# Patient Record
Sex: Female | Born: 1995 | Race: Asian | Hispanic: No | Marital: Single | Smoking: Current some day smoker
Health system: Southern US, Community
[De-identification: ages and names within clinical notes are randomized; demographics above are authoritative.]

---

## 2018-05-17 ENCOUNTER — Encounter (INDEPENDENT_AMBULATORY_CARE_PROVIDER_SITE_OTHER): Payer: Self-pay | Admitting: Internal Medicine

## 2018-05-17 ENCOUNTER — Ambulatory Visit (INDEPENDENT_AMBULATORY_CARE_PROVIDER_SITE_OTHER): Payer: BLUE CROSS/BLUE SHIELD | Admitting: Internal Medicine

## 2018-05-17 DIAGNOSIS — R1010 Upper abdominal pain, unspecified: Secondary | ICD-10-CM

## 2018-05-17 MED ORDER — PANTOPRAZOLE SODIUM 40 MG PO TBEC: 40.0000 mg | DELAYED_RELEASE_TABLET | Freq: Every day | ORAL | 1 refills | Status: AC

## 2018-05-17 NOTE — Patient Instructions (Signed)
HIDA scan. Rx for Protonix sent to her pharmacy

## 2018-05-17 NOTE — Progress Notes (Addendum)
   Subjective:    Patient ID: Teresa Thompson, female    DOB: 17-Sep-1996, 22 y.o.   MRN: [id number]  HPI Referred by Felicie Mornavid Smith PA-C  for constipation. Seen by PA-C for abdominal pain and was referred to the ED. Seen in the ED. She was given IV and pain medication.  She underwent a CT scan which was normal.  She was given Omeprazole. Finished the Omeprazole and now is OTC Omeprazole.  She had been hurting for 2 1/2 weeks. The pain is not constant.  Then pain occurs after eating.  She is not taking any NSAIDs. Last episode of pain was yesterday at lunch. She ate salad yesterday. She is on a Keto diet. She is avoiding spicy foods. Appetite is good. She has weight loss since starting the Keto diet 2 months ago.  Works at [employer] in [place]. Single, no children.  04/30/2018 CT abdomen/pelvis with CM: Abdominal pain without trauma No acute findings in the abdomen or pelvis. Tiny nonobstructing renal stone. Left ovarian cyst.  Review of Systems History reviewed. No pertinent past medical history.  History reviewed. No pertinent surgical history.  Allergies  Allergen Reactions  . Amoxicillin     Current Outpatient Medications on File Prior to Visit  Medication Sig Dispense Refill  . nitrofurantoin (MACRODANTIN) 100 MG capsule Take 100 mg by mouth 2 (two) times daily.    . NON FORMULARY BC pills.     No current facility-administered medications on file prior to visit.         Objective:   Physical Exam Blood pressure (!) 142/80, pulse 72, temperature (!) 97.5 F (36.4 C), height 5\' 2"  (1.575 m), weight 254 lb 14.4 oz (115.6 kg). Alert and oriented. Skin warm and dry. Oral mucosa is moist.   . Sclera anicteric, conjunctivae is pink. Thyroid not enlarged. No cervical lymphadenopathy. Lungs clear. Heart regular rate and rhythm.  Abdomen is soft. Bowel sounds are positive. No hepatomegaly. No abdominal masses felt. No tenderness.  No edema to lower extremities.             Assessment & Plan:  Epigastric pain after eating. Has been on a high fat diet. Am going to get a HIDA scan and Rx for Protonix sent to his pharmacy.,

## 2018-05-27 ENCOUNTER — Encounter (HOSPITAL_COMMUNITY)
Admission: RE | Admit: 2018-05-27 | Discharge: 2018-05-27 | Disposition: A | Discharge status: Home / Self Care | Payer: BLUE CROSS/BLUE SHIELD | Source: Ambulatory Visit | Attending: Internal Medicine | Admitting: Internal Medicine

## 2018-05-27 DIAGNOSIS — R1010 Upper abdominal pain, unspecified: Secondary | ICD-10-CM | POA: Insufficient documentation

## 2018-06-02 ENCOUNTER — Encounter (HOSPITAL_COMMUNITY)
Admission: RE | Admit: 2018-06-02 | Discharge: 2018-06-02 | Disposition: A | Discharge status: Home / Self Care | Payer: BLUE CROSS/BLUE SHIELD | Source: Ambulatory Visit | Attending: Internal Medicine | Admitting: Internal Medicine

## 2018-06-02 ENCOUNTER — Encounter (HOSPITAL_COMMUNITY): Payer: Self-pay

## 2018-06-02 DIAGNOSIS — R1010 Upper abdominal pain, unspecified: Secondary | ICD-10-CM | POA: Diagnosis present

## 2018-06-02 MED ORDER — TECHNETIUM TC 99M MEBROFENIN IV KIT
5.0000 | PACK | Freq: Once | INTRAVENOUS | Status: AC | PRN
  Administered 2018-06-02: 5 via INTRAVENOUS

## 2018-08-30 ENCOUNTER — Ambulatory Visit (INDEPENDENT_AMBULATORY_CARE_PROVIDER_SITE_OTHER): Payer: BLUE CROSS/BLUE SHIELD | Admitting: Internal Medicine

## 2019-11-03 IMAGING — NM NM HEPATO W/GB/PHARM/[PERSON_NAME]
2 series · 12 of 12 positions shown · non-contrast
Comparison: None

CLINICAL DATA: Upper abdominal pain for 3 weeks

EXAM:
NUCLEAR MEDICINE HEPATOBILIARY IMAGING WITH GALLBLADDER EF
TECHNIQUE: Sequential images of the abdomen were obtained [DATE] minutes
following intravenous administration of radiopharmaceutical. After
oral ingestion of Ensure, gallbladder ejection fraction was
determined. At 60 min, normal ejection fraction is greater than 33%.
RADIOPHARMACEUTICALS:  5 mCi Qc-33m  Choletec IV

[Series 1: biliary · 3.25mm/px · 6 of 60 frames shown]
[frame 6/60]
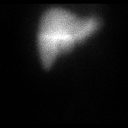
[frame 16/60]
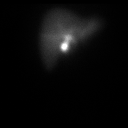
[frame 26/60]
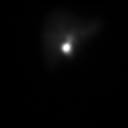
[frame 36/60]
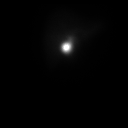
[frame 46/60]
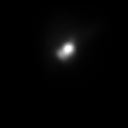
[frame 56/60]
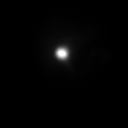

[Series 2: gbef · 3.25mm/px · 6 of 60 frames shown]
[frame 6/60]
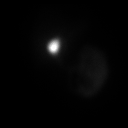
[frame 16/60]
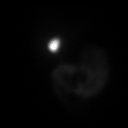
[frame 26/60]
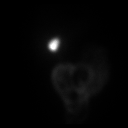
[frame 36/60]
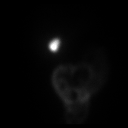
[frame 46/60]
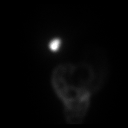
[frame 56/60]
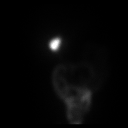

[12 of 12 positions shown; findings below may reference images not displayed]

FINDINGS: Normal tracer extraction from bloodstream indicating normal
hepatocellular function.

Normal excretion of tracer into biliary tree.

Gallbladder visualized at 12 min.

Small bowel visualized at 53 min.

No hepatic retention of tracer.

Subjectively normal emptying of tracer from gallbladder following
fatty meal stimulation.

Calculated gallbladder ejection fraction is 46%, normal.

Patient reported no symptoms following Ensure ingestion.

Normal gallbladder ejection fraction following Ensure ingestion is
greater than 33% at 1 hour.
IMPRESSION: Normal exam.
